# Patient Record
Sex: Male | Born: 1980 | Race: Black or African American | Hispanic: No | Marital: Single | Smoking: Never smoker
Health system: Southern US, Community
[De-identification: ages and names within clinical notes are randomized; demographics above are authoritative.]

## PROBLEM LIST (undated history)

## (undated) DIAGNOSIS — C801 Malignant (primary) neoplasm, unspecified: Secondary | ICD-10-CM

## (undated) HISTORY — DX: Malignant (primary) neoplasm, unspecified: C80.1

---

## 2004-12-01 ENCOUNTER — Ambulatory Visit (HOSPITAL_COMMUNITY): Admission: RE | Admit: 2004-12-01 | Discharge: 2004-12-01 | Payer: Self-pay | Admitting: Family Medicine

## 2009-02-08 ENCOUNTER — Encounter: Payer: Self-pay | Admitting: Pulmonary Disease

## 2009-03-03 DIAGNOSIS — C8190 Hodgkin lymphoma, unspecified, unspecified site: Secondary | ICD-10-CM | POA: Insufficient documentation

## 2009-03-04 ENCOUNTER — Ambulatory Visit: Payer: Self-pay | Admitting: Pulmonary Disease

## 2009-03-04 DIAGNOSIS — R0602 Shortness of breath: Secondary | ICD-10-CM

## 2009-03-14 LAB — CONVERTED CEMR LAB
ALT: 21 units/L (ref 0–53)
AST: 26 units/L (ref 0–37)
Albumin: 4.3 g/dL (ref 3.5–5.2)
Alkaline Phosphatase: 80 units/L (ref 39–117)
BUN: 13 mg/dL (ref 6–23)
Basophils Absolute: 0.4 10*3/uL — ABNORMAL HIGH (ref 0.0–0.1)
Basophils Relative: 6.1 % — ABNORMAL HIGH (ref 0.0–3.0)
Bilirubin, Direct: 0.1 mg/dL (ref 0.0–0.3)
CO2: 32 meq/L (ref 19–32)
Calcium: 9.3 mg/dL (ref 8.4–10.5)
Chloride: 100 meq/L (ref 96–112)
Creatinine, Ser: 0.9 mg/dL (ref 0.4–1.5)
Eosinophils Absolute: 0.1 10*3/uL (ref 0.0–0.7)
Eosinophils Relative: 1.2 % (ref 0.0–5.0)
GFR calc non Af Amer: 129.68 mL/min (ref >60)
Glucose, Bld: 86 mg/dL (ref 70–99)
HCT: 42.9 % (ref 39.0–52.0)
Hemoglobin: 14.3 g/dL (ref 13.0–17.0)
Lymphocytes Relative: 36.7 % (ref 12.0–46.0)
Lymphs Abs: 2.3 10*3/uL (ref 0.7–4.0)
MCHC: 33.3 g/dL (ref 30.0–36.0)
MCV: 91.8 fL (ref 78.0–100.0)
Monocytes Absolute: 0.7 10*3/uL (ref 0.1–1.0)
Monocytes Relative: 11.2 % (ref 3.0–12.0)
Neutro Abs: 2.8 10*3/uL (ref 1.4–7.7)
Neutrophils Relative %: 44.8 % (ref 43.0–77.0)
Platelets: 181 10*3/uL (ref 150.0–400.0)
Potassium: 4.1 meq/L (ref 3.5–5.1)
RBC: 4.68 M/uL (ref 4.22–5.81)
RDW: 12 % (ref 11.5–14.6)
Sodium: 137 meq/L (ref 135–145)
Total Bilirubin: 0.6 mg/dL (ref 0.3–1.2)
Total Protein: 7.1 g/dL (ref 6.0–8.3)
WBC: 6.3 10*3/uL (ref 4.5–10.5)

## 2009-04-15 ENCOUNTER — Ambulatory Visit: Payer: Self-pay | Admitting: Pulmonary Disease

## 2009-04-18 ENCOUNTER — Encounter: Payer: Self-pay | Admitting: Pulmonary Disease

## 2009-06-23 ENCOUNTER — Ambulatory Visit: Payer: Self-pay | Admitting: Pulmonary Disease

## 2009-06-23 DIAGNOSIS — J4489 Other specified chronic obstructive pulmonary disease: Secondary | ICD-10-CM | POA: Insufficient documentation

## 2009-06-23 DIAGNOSIS — J984 Other disorders of lung: Secondary | ICD-10-CM | POA: Insufficient documentation

## 2009-06-23 DIAGNOSIS — J449 Chronic obstructive pulmonary disease, unspecified: Secondary | ICD-10-CM

## 2009-07-20 ENCOUNTER — Telehealth (INDEPENDENT_AMBULATORY_CARE_PROVIDER_SITE_OTHER): Payer: Self-pay | Admitting: *Deleted

## 2009-07-22 ENCOUNTER — Telehealth (INDEPENDENT_AMBULATORY_CARE_PROVIDER_SITE_OTHER): Payer: Self-pay | Admitting: *Deleted

## 2009-10-03 ENCOUNTER — Telehealth (INDEPENDENT_AMBULATORY_CARE_PROVIDER_SITE_OTHER): Payer: Self-pay | Admitting: *Deleted

## 2009-10-14 ENCOUNTER — Telehealth (INDEPENDENT_AMBULATORY_CARE_PROVIDER_SITE_OTHER): Payer: Self-pay | Admitting: *Deleted

## 2009-11-03 ENCOUNTER — Telehealth: Payer: Self-pay | Admitting: Pulmonary Disease

## 2010-02-12 ENCOUNTER — Encounter: Payer: Self-pay | Admitting: Pulmonary Disease

## 2010-02-21 NOTE — Progress Notes (Signed)
Summary: nos appt  Phone Note Call from Patient   Caller: [email or web address]  Call For: Kelon Easom Summary of Call: ATC pt to rsc nos from 10/12, wrong number. Initial call taken by: Darletta Moll,  November 03, 2009 3:41 PM

## 2010-02-21 NOTE — Progress Notes (Signed)
Summary: letter re: breathalizer-ATCx2  Phone Note Call from Patient Call back at Home Phone [phone]   Caller: Patient Call For: SOOD Summary of Call: PT NEEDS LETTER IN DETAIL STATING "DIFFICULTY IN TAKING BREATHALIZER TEST DUE TO LUNG CONDITION". NEEDS DIAGNOSIS AS WELL. SAYS HE WENT DOWN TO MED RECS AND THE ONLY LETTER THEY HAD WAS ONE FROM JULY/ 2011. HE WANTS TO KNOW IF A CURRENT LETTER HAD ALREADY BEEN SENT TO HIS LAWYER. HE NEEDS THIS ASAP AS HIS COURT DATE IS OCT 10. [phone] (PT SAYS HE HAS VOICE MAIL AVAIL).  Initial call taken by: Tivis Ringer, CNA,  October 14, 2009 11:49 AM  Follow-up for Phone Call        called spoke with patient who states that he did receive the letter that was done by VS in june, but her his attorney it needs to specifically state that he has difficultly breathing in the breathalizer d/t his lung condition.  will forward to VS to see if this can be added to his letter.  pt aware will be later this afternoon before he receives a call back. Follow-up by: Boone Master CNA/MA,  October 14, 2009 12:13 PM  Additional Follow-up for Phone Call Additional follow up Details #1::        He was supposed to have CT chest and ROV with me after that.  This was scheduled in June 2011, but I am not sure he has gotten this done.  Advise him that he will need to have CT chest and ROV with me to further assess. Additional Follow-up by: Coralyn Helling MD,  October 14, 2009 1:48 PM    Additional Follow-up for Phone Call Additional follow up Details #2::    ATC pt x 2 attempts and no answer and message states voicemail not accepting messages. WCB Carron Curie CMA  October 14, 2009 2:53 PM  Spoke with pt and advised of recs per VS.  Pt verbalized understanding.  Appt sched with VS for 11/02/09 at 3:15 pm.  Pt advised to bring ct scan with him to appt.  Follow-up by: Vernie Murders,  October 14, 2009 5:01 PM

## 2010-02-21 NOTE — Miscellaneous (Signed)
Summary: Pulmonary function test   Pulmonary Function Test Date: 04/15/2009 Height (in.): 71 Gender: Male  Pre-Spirometry FVC    Value: 3.50 L/min   Pred: 5.59 L/min     % Pred: 63 % FEV1    Value: 1.63 L     Pred: 4.41 L     % Pred: 37 % FEV1/FVC  Value: 46 %     Pred: 78 %     % Pred: . % FEF 25-75  Value: 0.50 L/min   Pred: 4.63 L/min     % Pred: 11 %  Post-Spirometry FVC    Value: 3.64 L/min   Pred: 5.59 L/min     % Pred: 65 % FEV1    Value: 2.34 L     Pred: 4.41 L     % Pred: 53 % FEV1/FVC  Value: 64 %     Pred: 78 %     % Pred: . % FEF 25-75  Value: 1.21 L/min   Pred: 4.63 L/min     % Pred: 26 %  Lung Volumes TLC    Value: 4.38 L   % Pred: 59 % RV    Value: 0.88 L   % Pred: 47 % DLCO    Value: 19.4 %   % Pred: 58 % DLCO/VA  Value: 6.28 %   % Pred: 139 %  Comments: Severe obstruction.  Positive bronchodilator response.  Moderate restriction.  Moderate diffusion defect. Clinical Lists Changes  Observations: Added new observation of PFT COMMENTS: Severe obstruction.  Positive bronchodilator response.  Moderate restriction.  Moderate diffusion defect. (04/15/2009 13:16) Added new observation of DLCO/VA%EXP: 139 % (04/15/2009 13:16) Added new observation of DLCO/VA: 6.28 % (04/15/2009 13:16) Added new observation of DLCO % EXPEC: 58 % (04/15/2009 13:16) Added new observation of DLCO: 19.4 % (04/15/2009 13:16) Added new observation of RV % EXPECT: 47 % (04/15/2009 13:16) Added new observation of RV: 0.88 L (04/15/2009 13:16) Added new observation of TLC % EXPECT: 59 % (04/15/2009 13:16) Added new observation of TLC: 4.38 L (04/15/2009 13:16) Added new observation of FEF2575%EXPS: 26 % (04/15/2009 13:16) Added new observation of PSTFEF25/75P: 4.63  (04/15/2009 13:16) Added new observation of PSTFEF25/75%: 1.21 L/min (04/15/2009 13:16) Added new observation of PSTFEV1/FCV%: . % (04/15/2009 13:16) Added new observation of FEV1FVCPRDPS: 78 % (04/15/2009 13:16) Added new  observation of PSTFEV1/FVC: 64 % (04/15/2009 13:16) Added new observation of POSTFEV1%PRD: 53 % (04/15/2009 13:16) Added new observation of FEV1PRDPST: 4.41 L (04/15/2009 13:16) Added new observation of POST FEV1: 2.34 L/min (04/15/2009 13:16) Added new observation of POST FVC%EXP: 65 % (04/15/2009 13:16) Added new observation of FVCPRDPST: 5.59 L/min (04/15/2009 13:16) Added new observation of POST FVC: 3.64 L (04/15/2009 13:16) Added new observation of FEF % EXPEC: 11 % (04/15/2009 13:16) Added new observation of FEF25-75%PRE: 4.63 L/min (04/15/2009 13:16) Added new observation of FEF 25-75%: 0.50 L/min (04/15/2009 13:16) Added new observation of FEV1/FVC%EXP: . % (04/15/2009 13:16) Added new observation of FEV1/FVC PRE: 78 % (04/15/2009 13:16) Added new observation of FEV1/FVC: 46 % (04/15/2009 13:16) Added new observation of FEV1 % EXP: 37 % (04/15/2009 13:16) Added new observation of FEV1 PREDICT: 4.41 L (04/15/2009 13:16) Added new observation of FEV1: 1.63 L (04/15/2009 13:16) Added new observation of FVC % EXPECT: 63 % (04/15/2009 13:16) Added new observation of FVC PREDICT: 5.59 L (04/15/2009 13:16) Added new observation of FVC: 3.50 L (04/15/2009 13:16) Added new observation of PFT HEIGHT: 71  (04/15/2009 13:16) Added new observation of PFT DATE:  04/15/2009  (04/15/2009 13:16) 

## 2010-02-21 NOTE — Progress Notes (Signed)
Summary: letter by 09/14- ATC NA x6  Phone Note Call from Patient Call back at Home Phone [phone]   Caller: Patient Call For: sood Reason for Call: Talk to Nurse Summary of Call: pt needs another letter from VS stating that it would be extremely difficult for him to perform a breathalizer due to his lung condition.  He was stopped and they put on ticket he refused breathalizer because he couldn't get a reading from pt blowing.  Pt has a court date on Friday 09/16 so needs letter by Wednesday.  Have a copy of [employer] at [employer]  Print 2 copies so pt has copy also.  Initial call taken by: Eugene Gavia,  October 03, 2009 8:14 AM  Follow-up for Phone Call        ATC pt, voicemail full cannot leave message. WCB. Dr. Craige Cotta paged at 12pm. Carron Curie CMA  October 03, 2009 10:07 AM  ATC pt again, NA and mailbox is still full and could not leave msg Vernie Murders  October 03, 2009 1:40 PM  ATC x1. NA and mailbox is still full. Could not leave a msg. WCB later.Michel Bickers [facility]  October 04, 2009 8:52 AM  I have sent suponea down to [facility] in medical records, it is for all of pt records. I called med recs and spoke to dena and she said to send it to attention: Pam. I ATC pt again, NA and mailbox full. WCB.  Carron Curie CMA  October 05, 2009 9:23 AM  ATC pt at number in patient banner - was directed to a VM for pt's employer.  did not feel comfortable leaving message. Boone Master CNA/MA  October 05, 2009 5:30 PM    Additional Follow-up for Phone Call Additional follow up Details #1::        ATC pt again, mailbox is full.  This is the 6th attempt to contact the pt and so will sign off on msg and await for pt to call back. Additional Follow-up by: Vernie Murders,  October 06, 2009 2:12 PM

## 2010-02-21 NOTE — Assessment & Plan Note (Signed)
Summary: restrictive lung dz/apc   Copy to:  Elvina Sidle Primary Provider/Referring Provider:  Dr. Genia Hotter in [place], [place]  CC:  Pulmonary consult.  The patient c/o increased sob with exertion and a dry cough..  History of Present Illness: 30 yo male with dyspnea.  He has been feeling more short of breath over the past 4 months.  He was treated for Hodgkins lymphoma when he was 30 years old with chemotherapy and radiation therapy.    He used to exercise on a regular basis, but has not been able to keep up with this since he started working.  He does get a cough, but does not bring up sputum.  He denies wheeze, fever, chills, sweats, chest pain, weight loss, hemoptysis, gland swelling, leg swelling, rashes, or palpitations.  He remembers having a cold about 4 months ago when his symptoms started.  He has gained some weight since his exercise level has decreased.  He denies any prior history of asthma, pneumonia, or TB.  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  He denies animal exposure.  There is no recent sick exposures.  He last had a chest xray about 4 years ago.  He does not smoke cigarettes.  Spirometry from February 06, 2009 showed moderate restriction.  -  Date:  02/06/2009    FVC: 3.36    FVC % EXPECT: 64    FEV1: 3.11    FEV1 % EXP: 71    FEF 25-75% 4.53    FEF %Expect 97   Preventive Screening-Counseling & Management  Alcohol-Tobacco     Smoking Status: never      Drug Use:  yes.    Current Medications (verified): 1)  No Daily Medications  Allergies: 1)  ! Amoxicillin 2)  ! Sulfa  Past History:  Past Medical History: Last updated: 03/03/2009 Current Problems:  HODGKIN'S DISEASE (ICD-201.90)    Past Surgical History: None  Family History: Family History Breast Cancer---mother Family History Colon Cancer---father and PGF Family History Prostate Cancer---MGF  Social History: Patient admits to prior drug use.   Marijuana. Single [employer] weekly for his jobSmoking Status:  never Drug Use:  yes  Review of Systems       The patient complains of shortness of breath with activity, productive cough, non-productive cough, and nasal congestion/difficulty breathing through nose.  The patient denies shortness of breath at rest, coughing up blood, chest pain, irregular heartbeats, acid heartburn, indigestion, loss of appetite, weight change, abdominal pain, difficulty swallowing, sore throat, tooth/dental problems, headaches, sneezing, itching, ear ache, anxiety, depression, hand/feet swelling, joint stiffness or pain, rash, change in color of mucus, and fever.    Vital Signs:  Patient profile:   30 year old male Height:      70.5 inches (179.07 cm) Weight:      206 pounds (93.64 kg) BMI:     29.25 O2 Sat:      98 % on Room air Temp:     98.6 degrees F (37.00 degrees C) oral Pulse rate:   102 / minute BP sitting:   130 / 80  (left arm) Cuff size:   regular  Vitals Entered By: Michel Bickers CMA (March 04, 2009 4:46 PM)  O2 Sat at Rest %:  98 O2 Flow:  Room air CC: Pulmonary consult.  The patient c/o increased sob with exertion and a dry cough. Is Patient Diabetic? No   Physical Exam  General:  obese.   Eyes:  PERRLA  and EOMI.   Nose:  no deformity, discharge, inflammation, or lesions Mouth:  no deformity or lesions Neck:  no masses, thyromegaly, or abnormal cervical nodes Chest Wall:  no deformities noted Lungs:  clear bilaterally to auscultation and percussion Heart:  regular rate and rhythm, S1, S2 without murmurs, rubs, gallops, or clicks Abdomen:  bowel sounds positive; abdomen soft and non-tender without masses, or organomegaly Msk:  no deformity or scoliosis noted with normal posture Pulses:  pulses normal Extremities:  no clubbing, cyanosis, edema, or deformity noted Neurologic:  CN II-XII grossly intact with normal reflexes, coordination, muscle strength and  tone Cervical Nodes:  no significant adenopathy Psych:  alert and cooperative; normal mood and affect; normal attention span and concentration   Impression & Recommendations:  Problem # 1:  DYSPNEA (ICD-786.05) He has recent onset of dyspnea.  He has spirometry showing restrictive defect.  He has prior history of hodgkin's lymphoma and treatment with chemotherapy and radiation therapy to his chest.  He had a recent cold, and developed cough after this.  He could have developed reactive airways disease as a result of this.  He has also gained weight, and been less active since he had a change in his work schedule.  He could have a component of deconditioning.  To further assess this I will have him do lab work, chest xray, and full pulmonary function testing.  Depending on the results of this further interventions will be determined.  Medications Added to Medication List This Visit: 1)  No Daily Medications   Complete Medication List: 1)  No Daily Medications   Other Orders: Consultation Level IV (99244) TLB-BMP (Basic Metabolic Panel-BMET) (80048-METABOL) TLB-CBC Platelet - w/Differential (85025-CBCD) TLB-Hepatic/Liver Function Pnl (80076-HEPATIC) T-2 View CXR (71020TC) Full Pulmonary Function Test (PFT)  Patient Instructions: 1)  Lab work today 2)  Chest xray today 3)  Will schedule breathing test (PFT) 4)  Follow up in one to two weeks

## 2010-02-21 NOTE — Assessment & Plan Note (Signed)
Summary: rov ///kp   Copy to:  Elvina Sidle Primary Provider/Referring Provider:  Dr. Genia Hotter in [place], [place]  CC:  4 month follow up.  Pt would like to discuss results of PFT from 03/2008.   states breathing is the same-no better or worse.  occ chest tightness at night.  coughs at times-prod in am with yellow to brown mucus.  Denies wheezing. Marland Kitchen  History of Present Illness: 30 yo male with dyspnea.  He continues to have problems with his breathing.  He coughs on a regular basis, but does not bring up sputum.  If he does have sputum, then it is usually yellow in color.  He denies hemoptysis.  He will get occasional wheezing.  He feels cold at times, but denies fever.  He does get occasional sweats.  He denies chest pain, palpitations, weight loss, gland swelling, joint pain, or skin rashes.  Current Medications (verified): 1)  No Daily Medications  Allergies (verified): 1)  ! Amoxicillin 2)  ! Sulfa  Past History:  Past Medical History: Current Problems:  Hodgkins lymphoma dx 1999      - s/p chemo and radiation therapy Dyspnea      - PFT 04/15/09 FEV1 2.34 (53%), FEV1% 64, TLC 4.38(59%), DLCO 58%, +BD response  Past Surgical History: Reviewed history from 03/04/2009 and no changes required. None  Vital Signs:  Patient profile:   30 year old male Height:      71 inches Weight:      206 pounds BMI:     28.84 O2 Sat:      96 % on Room air Temp:     98.7 degrees F oral Pulse rate:   98 / minute BP sitting:   108 / 84  (right arm) Cuff size:   regular  Vitals Entered By: Gweneth Dimitri RN (June 23, 2009 12:05 PM)  O2 Flow:  Room air CC: 4 month follow up.  Pt would like to discuss results of PFT from 03/2008.   states breathing is the same-no better or worse.  occ chest tightness at night.  coughs at times-prod in am with yellow to brown mucus.  Denies wheezing.  Comments Medications reviewed with patient Daytime contact number verified with patient. Gweneth Dimitri RN   June 23, 2009 12:05 PM    Physical Exam  General:  obese.   Nose:  no deformity, discharge, inflammation, or lesions Mouth:  no deformity or lesions Neck:  no masses, thyromegaly, or abnormal cervical nodes Lungs:  clear bilaterally to auscultation and percussion Heart:  regular rate and rhythm, S1, S2 without murmurs, rubs, gallops, or clicks Abdomen:  bowel sounds positive; abdomen soft and non-tender without masses, or organomegaly Extremities:  no clubbing, cyanosis, edema, or deformity noted Cervical Nodes:  no significant adenopathy   CXR  Procedure date:  03/04/2009  Findings:      CHEST - 2 VIEW   Comparison: None.   Findings:  The heart size and mediastinal contours are within normal limits.  Both lungs are clear.  The visualized skeletal structures are unremarkable.   IMPRESSION: No active cardiopulmonary disease.   Impression & Recommendations:  Problem # 1:  DYSPNEA (ICD-786.05) He has prior history of Hodgkin's lymphoma.  He has both obstructive and restritive defect on pulmonary function testing.  Problem # 2:  CHRONIC OBSTRUCTIVE PULMONARY DISEASE (ICD-496) He has severe airflow obstruction on pulmonary function testing with response to bronchodilator challenge.  I will start him on symbicort and as  needed proair.  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  I will arrange for a CT chest with IV contrast to further evaluate.  Medications Added to Medication List This Visit: 1)  Symbicort 160-4.5 Mcg/act Aero (Budesonide-formoterol fumarate) .... Two puffs two times a day 2)  Proair Hfa 108 (90 Base) Mcg/act Aers (Albuterol sulfate) .... Two puffs up to four times per day as needed  Complete Medication List: 1)  Symbicort 160-4.5 Mcg/act Aero (Budesonide-formoterol fumarate) .... Two puffs two times a day 2)  Proair Hfa 108 (90 Base) Mcg/act Aers (Albuterol sulfate)  .... Two puffs up to four times per day as needed  Other Orders: Est. Patient Level III (28413) Radiology Referral (Radiology)  Patient Instructions: 1)  You have obstructive and restrictive lung disease 2)  Will schedule CT chest 3)  Symbicort two puffs two times a day, and rinse mouth after using 4)  Proair two puffs up to four times per day as needed for cough, wheeze, chest congestion, or shortness of breath 5)  Follow up in 3 to 4 weeks Prescriptions: PROAIR HFA 108 (90 BASE) MCG/ACT AERS (ALBUTEROL SULFATE) two puffs up to four times per day as needed  #1 x 3   Entered and Authorized by:   Coralyn Helling MD   Signed by:   Coralyn Helling MD on 06/23/2009   Method used:   Electronically to        [facility] [address] (retail)       [address]       [place], [place]  [id number]       Ph: [phone]       Fax: [phone]   RxID:   [id number] SYMBICORT 160-4.5 MCG/ACT AERO (BUDESONIDE-FORMOTEROL FUMARATE) two puffs two times a day  #1 x 3   Entered and Authorized by:   Coralyn Helling MD   Signed by:   Coralyn Helling MD on 06/23/2009   Method used:   Electronically to        [facility] [address] (retail)       [address]       [place], [place]  [id number]       Ph: [phone]       Fax: [phone]   RxID:   [id number]

## 2010-02-21 NOTE — Miscellaneous (Signed)
Summary: Orders Update pft charges  Clinical Lists Changes  Orders: Added new Service order of Carbon Monoxide diffusing w/capacity (94720) - Signed Added new Service order of Lung Volumes (94240) - Signed Added new Service order of Spirometry (Pre & Post) (94060) - Signed 

## 2010-02-21 NOTE — Progress Notes (Signed)
Summary: letter ready?  Phone Note Call from Patient Call back at Home Phone [phone]   Caller: Patient Call For: sood Summary of Call: pt wants to know if his letter is ready to be picked up (see 6/29 msg).  Initial call taken by: Tivis Ringer, CNA,  July 22, 2009 12:35 PM  Follow-up for Phone Call        Letter is ready and up front for pick up.  Spoke with pt and made aware this was done. Follow-up by: Vernie Murders,  July 22, 2009 12:43 PM

## 2010-02-21 NOTE — Progress Notes (Signed)
Summary: letter  Phone Note Call from Patient Call back at Home Phone [phone]   Caller: Patient Call For: sood Reason for Call: Talk to Nurse Summary of Call: pt would like narrative explaing his condition.  pt was stopped and given a breathalizer test.  Due to his lung condition, he was unable to blow enough for it to register.  Officer put it down as a refusal and took pt's license.  Attorney told him to get letter from you and has a hearing next week(07/06, would need by 07/05), he could get his license back.  He has been diagnosed w/restrictive lung ds, COPD  and he has chemo and radiation.  All this needs to go into letter to explain his health condition. Initial call taken by: Travis Garrett,  July 20, 2009 9:30 AM  Follow-up for Phone Call        Please advise thanks Travis Garrett  July 20, 2009 9:47 AM   Additional Follow-up for Phone Call Additional follow up Details #1::        Please inform that pt that I have dictated a letter stating his respiratory condition.  He can arrange to have this picked up or mailed to him after it is transcribed. Additional Follow-up by: Travis Helling MD,  July 20, 2009 12:13 PM     Appended Document: letter Spoke with pt and advised that VS dictated a letter for him and we will call him once transciption is done so he can pick up.  Will hold in triage until this is done.  Appended Document: letter The patient states he picked this letter up on Friday, 07/22/2009.

## 2010-06-09 NOTE — Letter (Signed)
July 20, 2009     RE:  [name], [name]  MRN:  [id number]  /  DOB:  [birth year]   To Whom It May Concern:   Travis Garrett is under my care at [facility] in [place],  [place].  He has severe obstructive lung disease as well as  restrictive lung disease.  In addition, he has a history of Hodgkin's  lymphoma and has previous treatment with chemotherapy and radiation  therapy.   If you have any questions regarding this, please feel free to contact me  at [phone].    Sincerely,      Coralyn Helling, MD  Electronically Signed    VS/MedQ  DD: 07/20/2009  DT: 07/21/2009  Job #: [id number]

## 2011-04-22 ENCOUNTER — Ambulatory Visit (INDEPENDENT_AMBULATORY_CARE_PROVIDER_SITE_OTHER): Payer: BC Managed Care – PPO | Admitting: Internal Medicine

## 2011-04-22 VITALS — BP 114/76 | HR 85 | Temp 98.5°F | Resp 16 | Ht 69.0 in | Wt 211.0 lb

## 2011-04-22 DIAGNOSIS — L03319 Cellulitis of trunk, unspecified: Secondary | ICD-10-CM

## 2011-04-22 DIAGNOSIS — L02214 Cutaneous abscess of groin: Secondary | ICD-10-CM

## 2011-04-22 MED ORDER — DOXYCYCLINE HYCLATE 100 MG PO TABS: 100.0000 mg | ORAL_TABLET | Freq: Two times a day (BID) | ORAL | Status: AC

## 2011-04-22 NOTE — Progress Notes (Signed)
  Subjective:    Patient ID: Travis Garrett, male    DOB: July 24, 1980, 31 y.o.   MRN: [id number]  HPIThree-day history of pain with swelling in the left groin/the pain is mild He has a history of an abscess in the groin on the other side one year ago    Review of Systems     Objective:   Physical ExamThere is a 1 cm x 3 cm indurated area that is not read but is tender in the left groin adjacent to the testicle/scrotum        Assessment & Plan:  Problem #1 early abscess left groin  He would like to avoid I&D if possible Doxycycline 100 twice a day #20 Hot compresses or soaks for 20 minutes twice a day Followup for surgery if needed

## 2011-04-24 ENCOUNTER — Telehealth: Payer: Self-pay

## 2011-04-24 NOTE — Telephone Encounter (Signed)
If it has fully resolved then yes. Just keep it covered.  Travis Garrett

## 2011-04-24 NOTE — Telephone Encounter (Signed)
LMOM TO CB 

## 2011-04-24 NOTE — Telephone Encounter (Signed)
PT RECENTLY TREATED FOR ABSCESS ON LEG,MUCH BETTER,WANTS TO KNOW IF IT IS OK FOR HIM TO GO GO GYM  TO WORK OUT.   BEST PHONE [phone]

## 2011-04-25 NOTE — Telephone Encounter (Signed)
Gave pt instr's from [place]. Pt agreed

## 2011-05-24 ENCOUNTER — Ambulatory Visit (INDEPENDENT_AMBULATORY_CARE_PROVIDER_SITE_OTHER): Payer: BC Managed Care – PPO | Admitting: Family Medicine

## 2011-05-24 VITALS — BP 115/81 | HR 85 | Temp 98.5°F | Resp 18 | Ht 69.0 in | Wt 205.2 lb

## 2011-05-24 DIAGNOSIS — K130 Diseases of lips: Secondary | ICD-10-CM

## 2011-05-24 DIAGNOSIS — R05 Cough: Secondary | ICD-10-CM

## 2011-05-24 LAB — POCT CBC
Lymph, poc: 2.2 (ref 0.6–3.4)
MCH, POC: 29.9 pg (ref 27–31.2)
MCHC: 32.7 g/dL (ref 31.8–35.4)
MCV: 91.5 fL (ref 80–97)
MID (cbc): 0.6 (ref 0–0.9)
MPV: 10.1 fL (ref 0–99.8)
POC LYMPH PERCENT: 31.9 %L (ref 10–50)
POC MID %: 8.2 %M (ref 0–12)
Platelet Count, POC: 254 10*3/uL (ref 142–424)
RBC: 5.39 M/uL (ref 4.69–6.13)
RDW, POC: 13.8 %
WBC: 7 10*3/uL (ref 4.6–10.2)

## 2011-05-24 MED ORDER — AZITHROMYCIN 250 MG PO TABS: ORAL_TABLET | ORAL | Status: AC

## 2011-05-24 MED ORDER — VALACYCLOVIR HCL 1 G PO TABS: ORAL_TABLET | ORAL | Status: DC

## 2011-05-24 MED ORDER — HYDROCODONE-HOMATROPINE 5-1.5 MG/5ML PO SYRP: 5.0000 mL | ORAL_SOLUTION | Freq: Three times a day (TID) | ORAL | Status: AC | PRN

## 2011-05-24 NOTE — Progress Notes (Signed)
Patient Name: Travis Garrett Date of Birth: 05-07-1980 Medical Record Number: [id number] Gender: male Date of Encounter: 05/24/2011  History of Present Illness:  Travis Garrett is a 31 y.o. very pleasant male patient who presents with the following:  Here with cough for about a week- had been productive but is now dry.  Also runny nose, no eye symptoms, no sneezing.  Also noted some irritation inside his lip over the last cople of days.  No history of HSV but he is very concerned that this might represent a cold sore.  No GI symptoms, no fever.  He did note some chills and aches but these are now better.   Does have a ST, but no earache.   These sympotms started when he was in [place] . Marland Kitchen    History of Hodgkin's disease at 31 years old but is in remission.  Otherwise healthy  Patient Active Problem List  Diagnoses  . HODGKIN'S DISEASE  . CHRONIC OBSTRUCTIVE PULMONARY DISEASE  . RESTRICTIVE LUNG DISEASE  . DYSPNEA   No past medical history on file. No past surgical history on file. History  Substance Use Topics  . Smoking status: Never Smoker   . Smokeless tobacco: Not on file  . Alcohol Use: Not on file   No family history on file. Allergies  Allergen Reactions  . Amoxicillin   . Sulfonamide Derivatives     Medication list has been reviewed and updated.  Review of Systems: As per HPI- otherwise negative.   Physical Examination: Filed Vitals:   05/24/11 1444  BP: 115/81  Pulse: 85  Temp: 98.5 F (36.9 C)  TempSrc: Oral  Resp: 18  Height: 5\' 9"  (1.753 m)  Weight: 205 lb 3.2 oz (93.078 kg)    Body mass index is 30.30 kg/(m^2).  GEN: WDWN, NAD, Non-toxic, A & O x 3 HEENT: Atraumatic, Normocephalic. Neck supple. No masses, No LAD.  Tm, oropharynx wnl.  Nasal cavity with some congestion, negative sinuses.  Unable to appreciate any abnormality of his lip Ears and Nose: No external deformity. CV: RRR, No M/G/R. No JVD. No thrill. No extra heart  sounds. PULM: CTA B, no wheezes, crackles, rhonchi. No retractions. No resp. distress. No accessory muscle use. EXTR: No c/c/e NEURO Normal gait.  PSYCH: Normally interactive. Conversant. Not depressed or anxious appearing.  Calm demeanor.   Results for orders placed in visit on 05/24/11  POCT CBC      Component Value Range   WBC 7.0  4.6 - 10.2 (K/uL)   Lymph, poc 2.2  0.6 - 3.4    POC LYMPH PERCENT 31.9  10 - 50 (%L)   MID (cbc) 0.6  0 - 0.9    POC MID % 8.2  0 - 12 (%M)   POC Granulocyte 4.2  2 - 6.9    Granulocyte percent 59.9  37 - 80 (%G)   RBC 5.39  4.69 - 6.13 (M/uL)   Hemoglobin 16.1  14.1 - 18.1 (g/dL)   HCT, POC 43.3  29.5 - 53.7 (%)   MCV 91.5  80 - 97 (fL)   MCH, POC 29.9  27 - 31.2 (pg)   MCHC 32.7  31.8 - 35.4 (g/dL)   RDW, POC 18.8     Platelet Count, POC 254  142 - 424 (K/uL)   MPV 10.1  0 - 99.8 (fL)    Assessment and Plan: 1. Cough  POCT CBC, HYDROcodone-homatropine (HYCODAN) 5-1.5 MG/5ML syrup, azithromycin (ZITHROMAX) 250 MG tablet  2. Lip lesion  valACYclovir (VALTREX) 1000 MG tablet   Likely viral URI vs AR.  Gave Rx for hycodan to use as needed, also recommend an OTC allergy medication.  However, if he is not better in a few days may fill and take zpack- in this case please call me with an update.  He is very concerned that the sensation he feels on his upper lip may herald a cold sore, and would like to take valtrex.  This is unlikely to be harmful so I gave him a prescription to use as needed.  Patient (or parent if minor) instructed to return to clinic or call if not better in 3-4 day(s).

## 2011-08-12 ENCOUNTER — Ambulatory Visit (INDEPENDENT_AMBULATORY_CARE_PROVIDER_SITE_OTHER): Payer: BC Managed Care – PPO | Admitting: Emergency Medicine

## 2011-08-12 VITALS — BP 132/70 | HR 85 | Temp 97.7°F | Resp 17 | Ht 68.5 in | Wt 205.0 lb

## 2011-08-12 DIAGNOSIS — H1030 Unspecified acute conjunctivitis, unspecified eye: Secondary | ICD-10-CM

## 2011-08-12 MED ORDER — TOBRAMYCIN 0.3 % OP SOLN: 2.0000 [drp] | OPHTHALMIC | Status: DC

## 2011-08-12 NOTE — Patient Instructions (Signed)
Conjunctivitis Conjunctivitis is commonly called "pink eye." Conjunctivitis can be caused by bacterial or viral infection, allergies, or injuries. There is usually redness of the lining of the eye, itching, discomfort, and sometimes discharge. There may be deposits of matter along the eyelids. A viral infection usually causes a watery discharge, while a bacterial infection causes a yellowish, thick discharge. Pink eye is very contagious and spreads by direct contact. You may be given antibiotic eyedrops as part of your treatment. Before using your eye medicine, remove all drainage from the eye by washing gently with warm water and cotton balls. Continue to use the medication until you have awakened 2 mornings in a row without discharge from the eye. Do not rub your eye. This increases the irritation and helps spread infection. Use separate towels from other household members. Wash your hands with soap and water before and after touching your eyes. Use cold compresses to reduce pain and sunglasses to relieve irritation from light. Do not wear contact lenses or wear eye makeup until the infection is gone. SEEK MEDICAL CARE IF:   Your symptoms are not better after 3 days of treatment.   You have increased pain or trouble seeing.   The outer eyelids become very red or swollen.  Document Released: 02/16/2004 Document Revised: 12/28/2010 Document Reviewed: 01/08/2005 [employer] Patient Information 2012 [employer]. 

## 2011-08-12 NOTE — Progress Notes (Signed)
  Subjective:    Patient ID: Travis Garrett, male    DOB: [birth year], 31 y.o.   MRN: [id number]  Eye Pain  There is pain in the right eye. This is a new problem. The current episode started yesterday. The problem occurs constantly. The problem has been unchanged. There was no injury mechanism. The pain is mild. There is no known exposure to pink eye. He does not wear contacts. Associated symptoms include an eye discharge and eye redness. Pertinent negatives include no blurred vision, double vision, fever, foreign body sensation, itching, nausea, photophobia, recent URI or vomiting. He has tried eye drops for the symptoms. The treatment provided no relief.      Review of Systems  Constitutional: Negative.  Negative for fever.  HENT: Positive for congestion and rhinorrhea.   Eyes: Positive for pain, discharge and redness. Negative for blurred vision, double vision and photophobia.  Respiratory: Positive for cough.   Cardiovascular: Negative.   Gastrointestinal: Negative.  Negative for nausea and vomiting.  Genitourinary: Negative.   Skin: Negative for itching.       Objective:   Physical Exam  Constitutional: He appears well-developed.  HENT:  Head: Normocephalic and atraumatic.  Eyes: Pupils are equal, round, and reactive to light. Right eye exhibits discharge. Right conjunctiva is injected. Right pupil is round and reactive. Left pupil is round and reactive. Pupils are equal.          Assessment & Plan:  Conjunctivitis  Sulamyd

## 2011-08-18 ENCOUNTER — Telehealth: Payer: Self-pay

## 2011-08-18 NOTE — Telephone Encounter (Signed)
Pt was recently given drops for his right eye and he has lost them and would like to have them called back into [facility]   Best number [phone]

## 2011-08-19 ENCOUNTER — Ambulatory Visit (INDEPENDENT_AMBULATORY_CARE_PROVIDER_SITE_OTHER): Payer: BC Managed Care – PPO | Admitting: Family Medicine

## 2011-08-19 VITALS — BP 98/72 | HR 87 | Temp 98.3°F | Resp 16

## 2011-08-19 DIAGNOSIS — H109 Unspecified conjunctivitis: Secondary | ICD-10-CM

## 2011-08-19 DIAGNOSIS — J069 Acute upper respiratory infection, unspecified: Secondary | ICD-10-CM

## 2011-08-19 MED ORDER — CIPROFLOXACIN HCL 0.3 % OP SOLN: 1.0000 [drp] | OPHTHALMIC | Status: AC

## 2011-08-19 NOTE — Progress Notes (Signed)
Subjective:    Patient ID: Travis Garrett, male    DOB: 04/11/1980, 31 y.o.   MRN: [id number]  HPI Travis Garrett is a 31 y.o. male Treated for R eye conjunctivitis 08/12/11 with tobramycin gtts - 2 drops used every 2 hours - ran out 2 days ago, had been using them in R eye only, was feeling better.  L eye now affected - red past 2 days.  Watery discharge from R eye only during day with yellow crust in am. , but less redness and swelling.   Took leftover antibiotic yesterday - doxycycline x 2. Seemed better.  Has had some cold symptoms - past week.  Same as last week.  No fever.  No allergies.    Review of Systems  Constitutional: Negative for fever and chills.  HENT: Positive for congestion and rhinorrhea.   Eyes: Positive for pain and discharge. Negative for photophobia.       Objective:   Physical Exam  Constitutional: He is oriented to person, place, and time. He appears well-developed and well-nourished.  HENT:  Head: Normocephalic and atraumatic.  Right Ear: Tympanic membrane, external ear and ear canal normal.  Left Ear: Tympanic membrane, external ear and ear canal normal.  Nose: No rhinorrhea.  Mouth/Throat: Oropharynx is clear and moist and mucous membranes are normal. No oropharyngeal exudate or posterior oropharyngeal erythema.  Eyes: EOM and lids are normal. Pupils are equal, round, and reactive to light. Right eye exhibits no discharge. No foreign body present in the right eye. Left eye exhibits no discharge. No foreign body present in the left eye. Right conjunctiva is injected. Left conjunctiva is injected.       No exudate in canthi. No lid edema.   Neck: Neck supple.  Cardiovascular: Normal rate, regular rhythm, normal heart sounds and intact distal pulses.   No murmur heard. Pulmonary/Chest: Effort normal and breath sounds normal. He has no wheezes. He has no rhonchi. He has no rales.  Abdominal: Soft. There is no tenderness.  Lymphadenopathy:    He has no  cervical adenopathy.  Neurological: He is alert and oriented to person, place, and time.  Skin: Skin is warm and dry. No rash noted.  Psychiatric: He has a normal mood and affect. His behavior is normal.       Assessment & Plan:  Travis Garrett is a 31 y.o. male 1. Conjunctivitis of both eyes  ciprofloxacin (CILOXAN) 0.3 % ophthalmic solution  2. URI (upper respiratory infection)     Viral cause with URi, vs secondary bact conjunctivitis - now on L side as well (early). Trial of ciloxan gtts, and recheck in 3-4 days if not improving.  Patient Instructions  Conjunctivitis Conjunctivitis is commonly called "pink eye." Conjunctivitis can be caused by bacterial or viral infection, allergies, or injuries. There is usually redness of the lining of the eye, itching, discomfort, and sometimes discharge. There may be deposits of matter along the eyelids. A viral infection usually causes a watery discharge, while a bacterial infection causes a yellowish, thick discharge. Pink eye is very contagious and spreads by direct contact. You may be given antibiotic eyedrops as part of your treatment. Before using your eye medicine, remove all drainage from the eye by washing gently with warm water and cotton balls. Continue to use the medication until you have awakened 2 mornings in a row without discharge from the eye. Do not rub your eye. This increases the irritation and helps spread infection. Use separate towels  from other household members. Wash your hands with soap and water before and after touching your eyes. Use cold compresses to reduce pain and sunglasses to relieve irritation from light. Do not wear contact lenses or wear eye makeup until the infection is gone. SEEK MEDICAL CARE IF:   Your symptoms are not better after 3 days of treatment.   You have increased pain or trouble seeing.   The outer eyelids become very red or swollen.  Document Released: 02/16/2004 Document Revised: 12/28/2010  Document Reviewed: 01/08/2005 [facility] Patient Information 2012 [place], [place].   Start new antibiotic drops.  Of to use refresh eye drops as needed for dry eyes.  If not improving in next 3-4 days, return for recheck. Return to the clinic or go to the nearest emergency room if any of your symptoms worsen or new symptoms occur.    '

## 2011-08-19 NOTE — Patient Instructions (Signed)
Conjunctivitis Conjunctivitis is commonly called "pink eye." Conjunctivitis can be caused by bacterial or viral infection, allergies, or injuries. There is usually redness of the lining of the eye, itching, discomfort, and sometimes discharge. There may be deposits of matter along the eyelids. A viral infection usually causes a watery discharge, while a bacterial infection causes a yellowish, thick discharge. Pink eye is very contagious and spreads by direct contact. You may be given antibiotic eyedrops as part of your treatment. Before using your eye medicine, remove all drainage from the eye by washing gently with warm water and cotton balls. Continue to use the medication until you have awakened 2 mornings in a row without discharge from the eye. Do not rub your eye. This increases the irritation and helps spread infection. Use separate towels from other household members. Wash your hands with soap and water before and after touching your eyes. Use cold compresses to reduce pain and sunglasses to relieve irritation from light. Do not wear contact lenses or wear eye makeup until the infection is gone. SEEK MEDICAL CARE IF:   Your symptoms are not better after 3 days of treatment.   You have increased pain or trouble seeing.   The outer eyelids become very red or swollen.  Document Released: 02/16/2004 Document Revised: 12/28/2010 Document Reviewed: 01/08/2005 [facility] Patient Information 2012 [place], [place].   Start new antibiotic drops.  Of to use refresh eye drops as needed for dry eyes.  If not improving in next 3-4 days, return for recheck. Return to the clinic or go to the nearest emergency room if any of your symptoms worsen or new symptoms occur.

## 2012-01-03 ENCOUNTER — Ambulatory Visit (INDEPENDENT_AMBULATORY_CARE_PROVIDER_SITE_OTHER): Payer: BC Managed Care – PPO | Admitting: Family Medicine

## 2012-01-03 ENCOUNTER — Ambulatory Visit: Payer: BC Managed Care – PPO

## 2012-01-03 VITALS — BP 129/85 | HR 68 | Temp 98.3°F | Resp 16 | Ht 68.0 in | Wt 197.0 lb

## 2012-01-03 DIAGNOSIS — S6990XA Unspecified injury of unspecified wrist, hand and finger(s), initial encounter: Secondary | ICD-10-CM

## 2012-01-03 DIAGNOSIS — M79609 Pain in unspecified limb: Secondary | ICD-10-CM

## 2012-01-03 DIAGNOSIS — S6010XA Contusion of unspecified finger with damage to nail, initial encounter: Secondary | ICD-10-CM

## 2012-01-03 DIAGNOSIS — S6000XA Contusion of unspecified finger without damage to nail, initial encounter: Secondary | ICD-10-CM

## 2012-01-03 DIAGNOSIS — M79646 Pain in unspecified finger(s): Secondary | ICD-10-CM

## 2012-01-03 MED ORDER — TRAMADOL HCL 50 MG PO TABS: 50.0000 mg | ORAL_TABLET | Freq: Three times a day (TID) | ORAL | Status: DC | PRN

## 2012-01-03 NOTE — Progress Notes (Signed)
Patient ID: BERTIN INABINET MRN: [id number], DOB: 1980/04/09, 31 y.o. Date of Encounter: 01/03/2012, 4:41 PM    PROCEDURE NOTE: Verbal consent obtained. Alcohol prep. Digital block with 2% lidocaine plain. 0.5 cm incision made with 11 blade along lesion.  Moderate amount of bloody drainage expressed. Dressed. Wound care instructions including precautions with patient. Patient tolerated the procedure well.  Loop cautery used to puncture #2 holes in nail releasing moderate amount of bloody drainage Cleaned and bandaged. Patient tolerated well. Finger placed in metal fold over splint for comfort.  Follow up in 1 week for recheck.   Grier Mitts, PA-C 01/03/2012 4:41 PM

## 2012-01-03 NOTE — Progress Notes (Signed)
[facility]:  Office Visit  Chief Complaint:  Chief Complaint  Patient presents with  . Hand Pain    right index finger smashed x 2 days    HPI: Travis Garrett is a 31 y.o. male who complains of smashed right index finger 2 days ago, constant throbbing pain 8/10 pain at night, bt today it is a 4 . Can't bend it. Took Tylenol with minimal relief.   Past Medical History  Diagnosis Date  . Cancer    History reviewed. No pertinent past surgical history. History   Social History  . Marital Status: Single    Spouse Name: N/A    Number of Children: N/A  . Years of Education: N/A   Social History Main Topics  . Smoking status: Never Smoker   . Smokeless tobacco: None  . Alcohol Use: None  . Drug Use: None  . Sexually Active: None   Other Topics Concern  . None   Social History Narrative  . None   No family history on file. Allergies  Allergen Reactions  . Amoxicillin   . Sulfonamide Derivatives    Prior to Admission medications   Medication Sig Start Date End Date Taking? Authorizing Provider  valACYclovir (VALTREX) 1000 MG tablet Two pills by mouth every 12 hours for 2 doses per episode of cold sore 05/24/11 05/23/12  Gwenlyn Found Copland, MD     ROS: The patient denies fevers, chills, night sweats, unintentional weight loss, chest pain, palpitations, wheezing, dyspnea on exertion, nausea, vomiting, abdominal pain, dysuria, hematuria, melena, numbness, weakness, or tingling.  All other systems have been reviewed and were otherwise negative with the exception of those mentioned in the HPI and as above.    PHYSICAL EXAM: Filed Vitals:   01/03/12 1448  BP: 129/85  Pulse: 68  Temp: 98.3 F (36.8 C)  Resp: 16   Filed Vitals:   01/03/12 1448  Height: 5\' 8"  (1.727 m)  Weight: 197 lb (89.359 kg)   Body mass index is 29.95 kg/(m^2).  General: Alert, no acute distress HEENT:  Normocephalic, atraumatic, oropharynx patent.  Cardiovascular:  Regular  rate and rhythm, no rubs murmurs or gallops.  No Carotid bruits, radial pulse intact. No pedal edema.  Respiratory: Clear to auscultation bilaterally.  No wheezes, rales, or rhonchi.  No cyanosis, no use of accessory musculature GI: No organomegaly, abdomen is soft and non-tender, positive bowel sounds.  No masses. Skin: No rashes. Neurologic: Facial musculature symmetric. Psychiatric: Patient is appropriate throughout our interaction. Lymphatic: No cervical lymphadenopathy Musculoskeletal: Gait intact. Right index finger-+ large nail hematoma.  +subungal hematoma ROM intact. + swelling, tendon at DIP and PIP intact   LABS: Results for orders placed in visit on 05/24/11  POCT CBC      Component Value Range   WBC 7.0  4.6 - 10.2 K/uL   Lymph, poc 2.2  0.6 - 3.4   POC LYMPH PERCENT 31.9  10 - 50 %L   MID (cbc) 0.6  0 - 0.9   POC MID % 8.2  0 - 12 %M   POC Granulocyte 4.2  2 - 6.9   Granulocyte percent 59.9  37 - 80 %G   RBC 5.39  4.69 - 6.13 M/uL   Hemoglobin 16.1  14.1 - 18.1 g/dL   HCT, POC 16.1  09.6 - 53.7 %   MCV 91.5  80 - 97 fL   MCH, POC 29.9  27 - 31.2 pg   MCHC 32.7  31.8 - 35.4 g/dL   RDW, POC 54.0     Platelet Count, POC 254  142 - 424 K/uL   MPV 10.1  0 - 99.8 fL     EKG/XRAY:   Primary read interpreted by Dr. Conley Rolls at [facility]. No obvious fractures, dislocation   ASSESSMENT/PLAN: Encounter Diagnoses  Name Primary?  . Injury of index finger Yes  . Hematoma, subungual, finger    Wound care as directed Tramadol prn pain Finger splint F/u prn otherwise will see him in 1 week     Yunis Voorheis PHUONG, DO 01/03/2012 3:48 PM

## 2013-02-06 ENCOUNTER — Ambulatory Visit (INDEPENDENT_AMBULATORY_CARE_PROVIDER_SITE_OTHER): Payer: BC Managed Care – PPO | Admitting: Family Medicine

## 2013-02-06 ENCOUNTER — Ambulatory Visit: Payer: BC Managed Care – PPO

## 2013-02-06 VITALS — BP 120/84 | HR 82 | Temp 99.1°F | Resp 16 | Ht 69.0 in | Wt 201.0 lb

## 2013-02-06 DIAGNOSIS — J189 Pneumonia, unspecified organism: Secondary | ICD-10-CM

## 2013-02-06 DIAGNOSIS — R059 Cough, unspecified: Secondary | ICD-10-CM

## 2013-02-06 DIAGNOSIS — R509 Fever, unspecified: Secondary | ICD-10-CM

## 2013-02-06 DIAGNOSIS — R05 Cough: Secondary | ICD-10-CM

## 2013-02-06 LAB — POCT CBC
Granulocyte percent: 57 %G (ref 37–80)
HCT, POC: 46.2 % (ref 43.5–53.7)
Hemoglobin: 14.7 g/dL (ref 14.1–18.1)
LYMPH, POC: 2.1 (ref 0.6–3.4)
MCH: 30 pg (ref 27–31.2)
MCHC: 31.8 g/dL (ref 31.8–35.4)
MCV: 94.3 fL (ref 80–97)
MID (cbc): 0.6 (ref 0–0.9)
MPV: 9.7 fL (ref 0–99.8)
PLATELET COUNT, POC: 209 10*3/uL (ref 142–424)
POC Granulocyte: 3.7 (ref 2–6.9)
POC LYMPH PERCENT: 33 %L (ref 10–50)
POC MID %: 10 %M (ref 0–12)
RBC: 4.9 M/uL (ref 4.69–6.13)
RDW, POC: 13.7 %
WBC: 6.5 10*3/uL (ref 4.6–10.2)

## 2013-02-06 MED ORDER — HYDROCODONE-HOMATROPINE 5-1.5 MG/5ML PO SYRP: 5.0000 mL | ORAL_SOLUTION | Freq: Three times a day (TID) | ORAL | Status: AC | PRN

## 2013-02-06 MED ORDER — AZITHROMYCIN 250 MG PO TABS: ORAL_TABLET | ORAL | Status: AC

## 2013-02-06 NOTE — Patient Instructions (Signed)
Use the antibiotic as directed and the cough syrup as needed.  Remember the cough syrup can make you drowsy so do not use it when you need to drive.   Let us know if you are not better in the next few days- Sooner if worse.

## 2013-02-06 NOTE — Progress Notes (Signed)
[facility] [address] [place] [address] [phone]  Date:  02/06/2013   Name:  Travis Garrett   DOB:  1980/07/20   MRN:  [id number]  PCP:  No primary provider on file.    Chief Complaint: chest congestion, Nasal Congestion and cbc   History of Present Illness:  Travis Garrett is a 33 y.o. very pleasant male patient who presents with the following:  He has noted almost a week of cough, congestion, and nasal congestion/ sneezing.  He has felt feverish at times at home but has not checked his temp.   The cough is productive in the am.  However it is becoming more dry.   He has not noted a ST or earache.   He has not had any GI symptoms.   He has used some OTC medication as needed.  No meds yet today.    He has a history of Hodgkin's disease.  He was treated in [place] in 1997 and has been in remission since 1998. He was treated with radiation for 3 months and 6 months of chemo.   He does not have a PCP here in [place]  He had some pulmonary testing in the past that might have showed COPD? However he states he is not sure if this was an accurate dx.  When he is not sick he does not have any breathing issues. He does exercise usually; he has not exercised for the last few months but prior to this he was running a mile each day and lifting weights without difficulty.   Does smoke MJ daily- has done so for around 8 years.  Does not smoke tobacco   Patient Active Problem List   Diagnosis Date Noted  . CHRONIC OBSTRUCTIVE PULMONARY DISEASE 06/23/2009  . RESTRICTIVE LUNG DISEASE 06/23/2009  . DYSPNEA 03/04/2009  . [facility] DISEASE 03/03/2009    Past Medical History  Diagnosis Date  . Cancer     History reviewed. No pertinent past surgical history.  History  Substance Use Topics  . Smoking status: Never Smoker   . Smokeless tobacco: Not on file  . Alcohol Use: No    History reviewed. No pertinent family history.  Allergies  Allergen  Reactions  . Amoxicillin   . Sulfonamide Derivatives     Medication list has been reviewed and updated.  Current Outpatient Prescriptions on File Prior to Visit  Medication Sig Dispense Refill  . traMADol (ULTRAM) 50 MG tablet Take 1 tablet (50 mg total) by mouth every 8 (eight) hours as needed for pain.  30 tablet  0   No current facility-administered medications on file prior to visit.    Review of Systems:  As per HPI- otherwise negative. Wt Readings from Last 3 Encounters:  02/06/13 201 lb (91.173 kg)  01/03/12 197 lb (89.359 kg)  08/12/11 205 lb (92.987 kg)   Physical Examination: Filed Vitals:   02/06/13 1023  BP: 120/84  Pulse: 82  Temp: 99.1 F (37.3 C)  Resp: 16   Filed Vitals:   02/06/13 1023  Height: 5\' 9"  (1.753 m)  Weight: 201 lb (91.173 kg)   Body mass index is 29.67 kg/(m^2). Ideal Body Weight: Weight in (lb) to have BMI = 25: 168.9  GEN: WDWN, NAD, Non-toxic, A & O x 3, overweight, looks well HEENT: Atraumatic, Normocephalic. Neck supple. No masses, No LAD.  Bilateral TM wnl, oropharynx normal.  PEERL,EOMI.   Ears and Nose: No external deformity.  CV: RRR, No M/G/R. No JVD. No thrill. No extra heart sounds. PULM: CTA B, no wheezes, crackles, rhonchi. No retractions. No resp. distress. No accessory muscle use. ABD: S, NT, ND, +BS. No rebound. No HSM. EXTR: No c/c/e NEURO Normal gait.  PSYCH: Normally interactive. Conversant. Not depressed or anxious appearing.  Calm demeanor.   [facility] reading (PRIMARY) by  Dr. Lorelei Pont. CXR: negative  CHEST 2 VIEW  COMPARISON: 03/04/2009  FINDINGS: Normal heart size, mediastinal contours, and pulmonary vascularity.  Lungs clear.  No pleural effusion or pneumothorax.  Bones unremarkable.  IMPRESSION: No acute abnormalities.   Results for orders placed in visit on 02/06/13  POCT CBC      Result Value Range   WBC 6.5  4.6 - 10.2 K/uL   Lymph, poc 2.1  0.6 - 3.4   POC LYMPH PERCENT 33.0  10 - 50 %L    MID (cbc) 0.6  0 - 0.9   POC MID % 10.0  0 - 12 %M   POC Granulocyte 3.7  2 - 6.9   Granulocyte percent 57.0  37 - 80 %G   RBC 4.90  4.69 - 6.13 M/uL   Hemoglobin 14.7  14.1 - 18.1 g/dL   HCT, POC 46.2  43.5 - 53.7 %   MCV 94.3  80 - 97 fL   MCH, POC 30.0  27 - 31.2 pg   MCHC 31.8  31.8 - 35.4 g/dL   RDW, POC 13.7     Platelet Count, POC 209  142 - 424 K/uL   MPV 9.7  0 - 99.8 fL    Assessment and Plan: Walking pneumonia - Plan: azithromycin (ZITHROMAX) 250 MG tablet  Cough - Plan: POCT CBC, DG Chest 2 View, HYDROcodone-homatropine (HYCODAN) 5-1.5 MG/5ML syrup  Low grade fever - Plan: DG Chest 2 View  At this time his blood count and CXR are reassuring.  Will treat with azithromycin and hycodan as needed See patient instructions for more details.   Encouraged him to stop his daily MJ smoking; this is also not good for his lungs   Signed Lamar Blinks, MD

## 2014-07-10 IMAGING — CR DG FINGER INDEX 2+V*R*
1 series · 1 of 1 positions shown · non-contrast
Comparison: Preliminary reading Dr. Kaler

CLINICAL DATA: Injury

RIGHT INDEX FINGER 2+V

[PA]
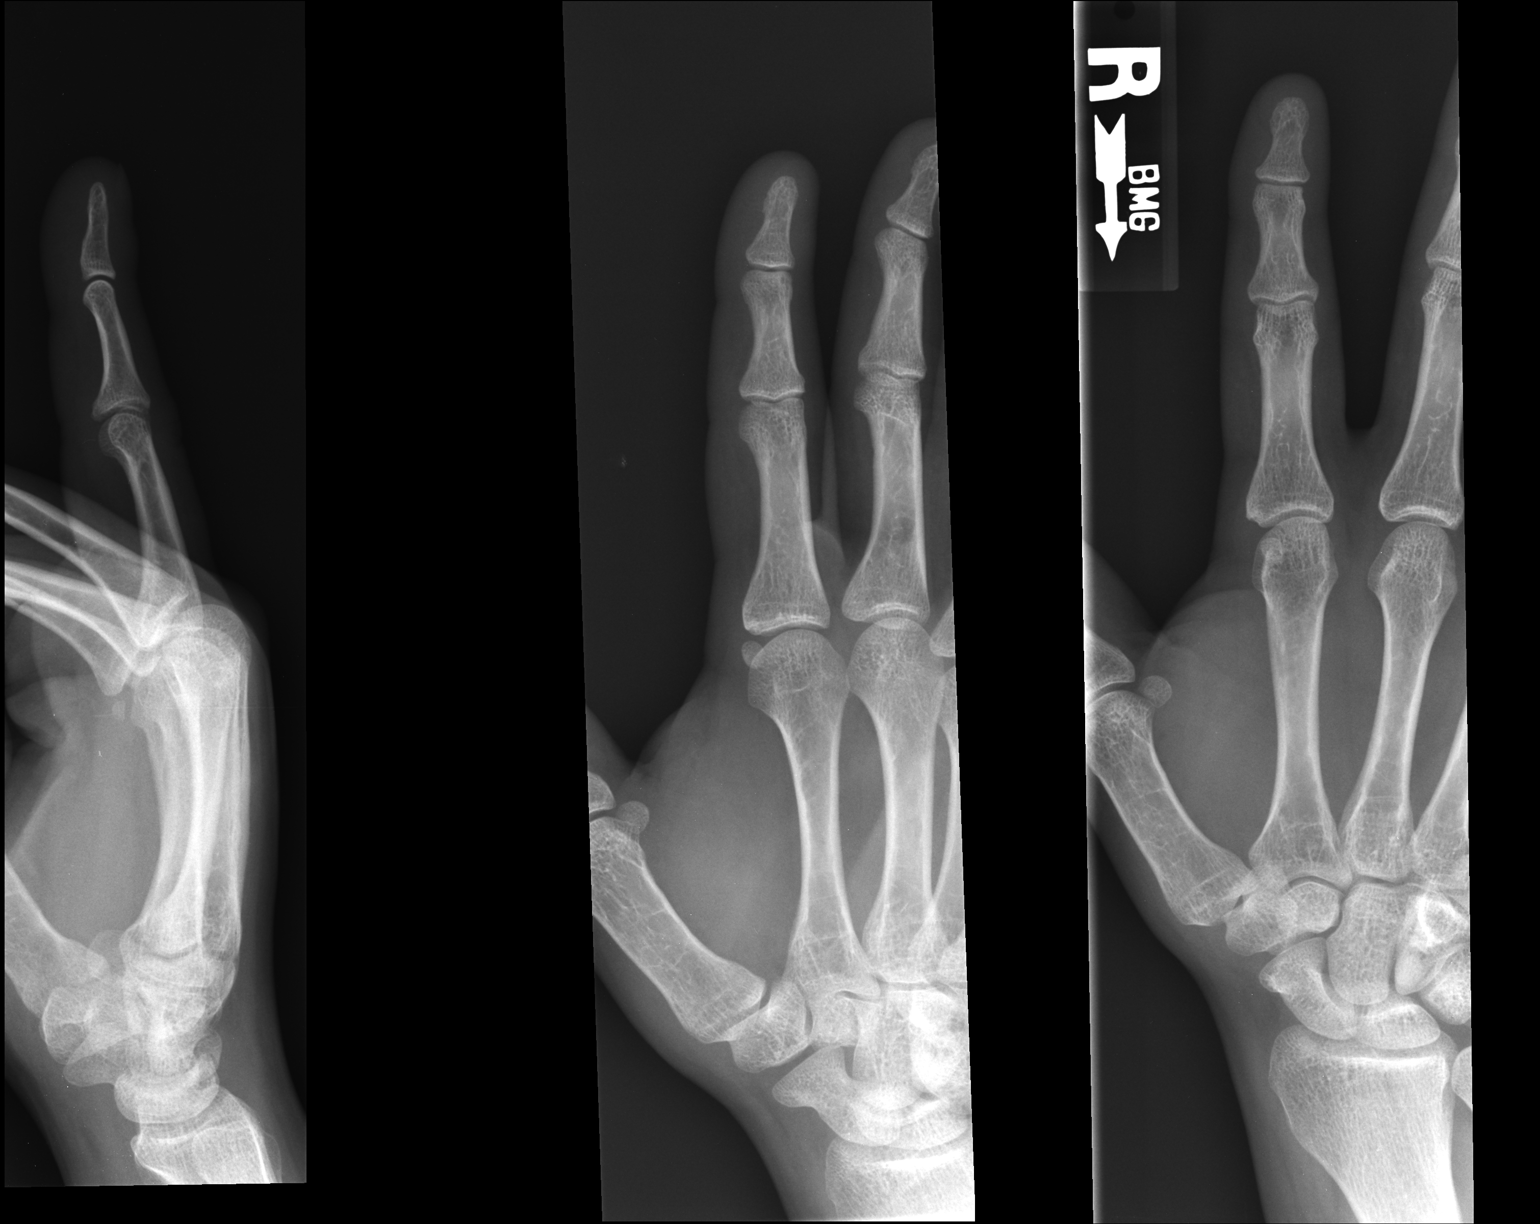

[1 of 1 positions shown; findings below may reference images not displayed]

FINDINGS: Three views of the right second finger submitted.  No
acute fracture or subluxation.
IMPRESSION: No acute fracture or subluxation.

## 2015-08-14 IMAGING — CR DG CHEST 2V
2 series · 2 of 2 positions shown · non-contrast
Comparison: 03/04/2009

CLINICAL DATA: Shortness of breath, cough, low-grade fever, history
lymphoma

EXAM:
CHEST  2 VIEW

[PA]
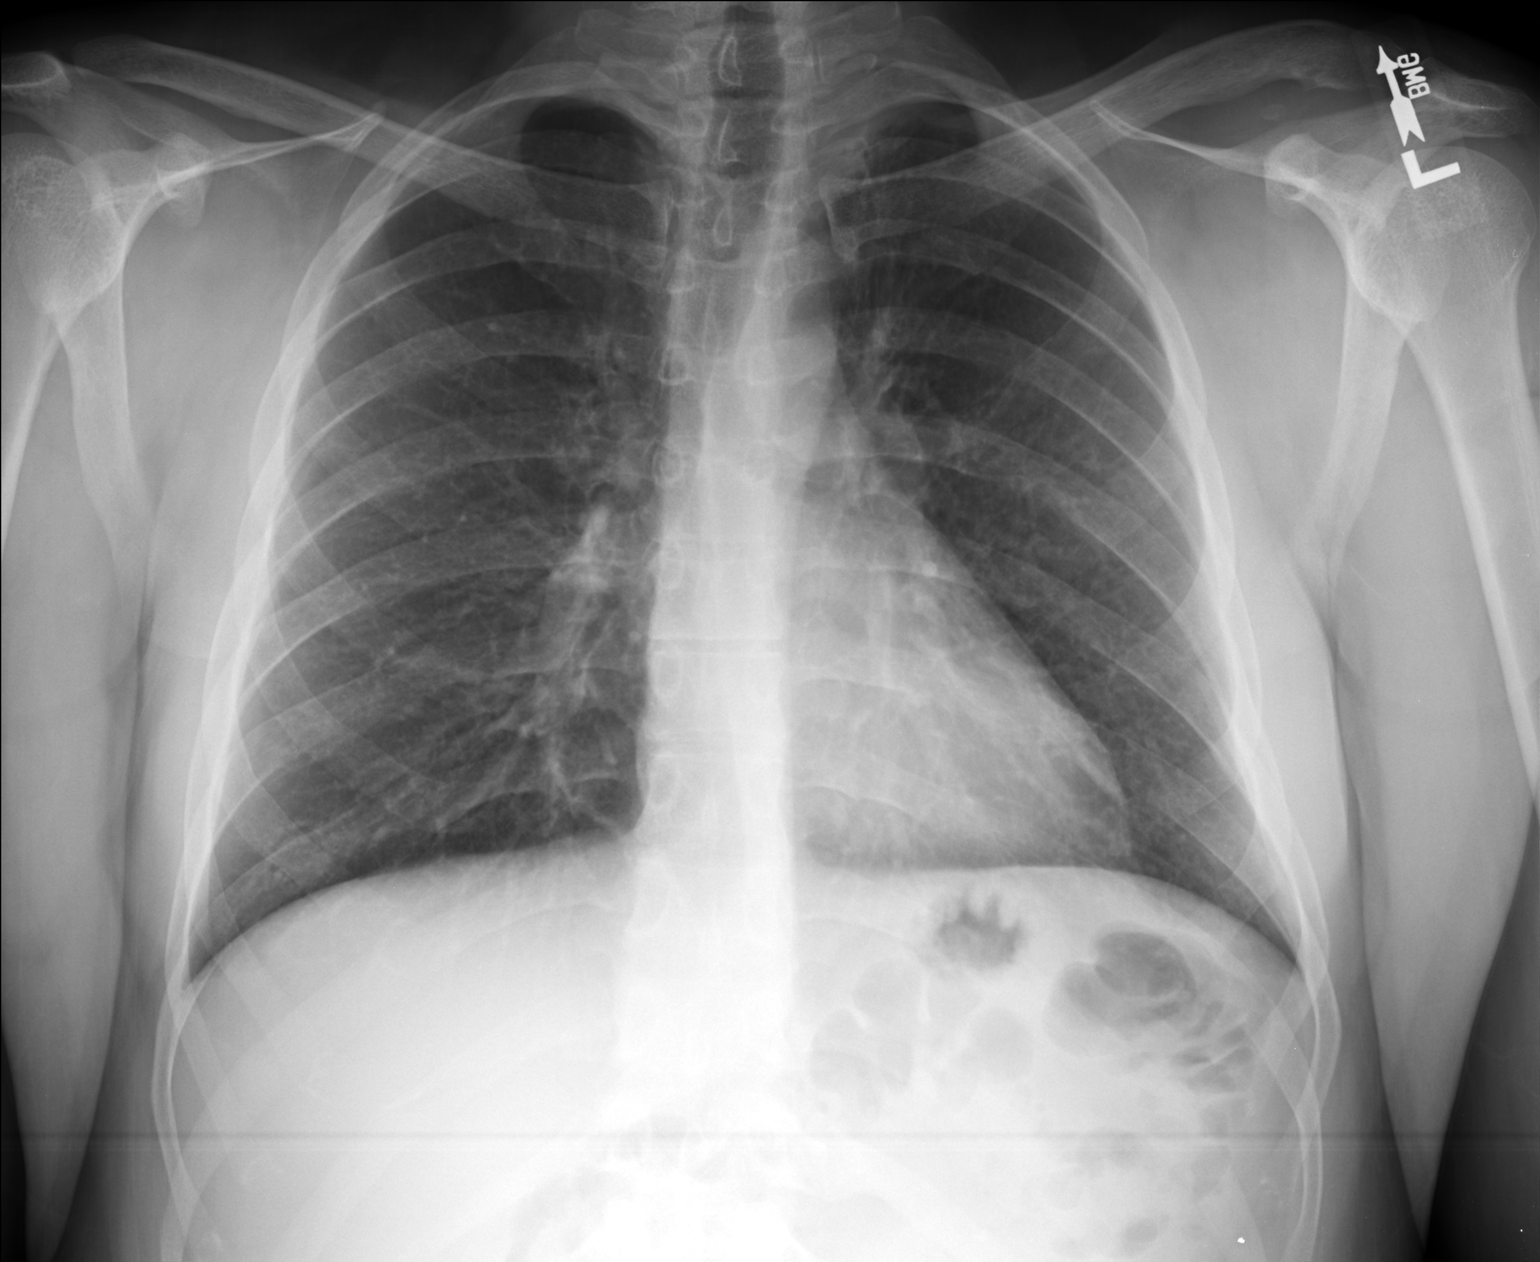

[lateral]
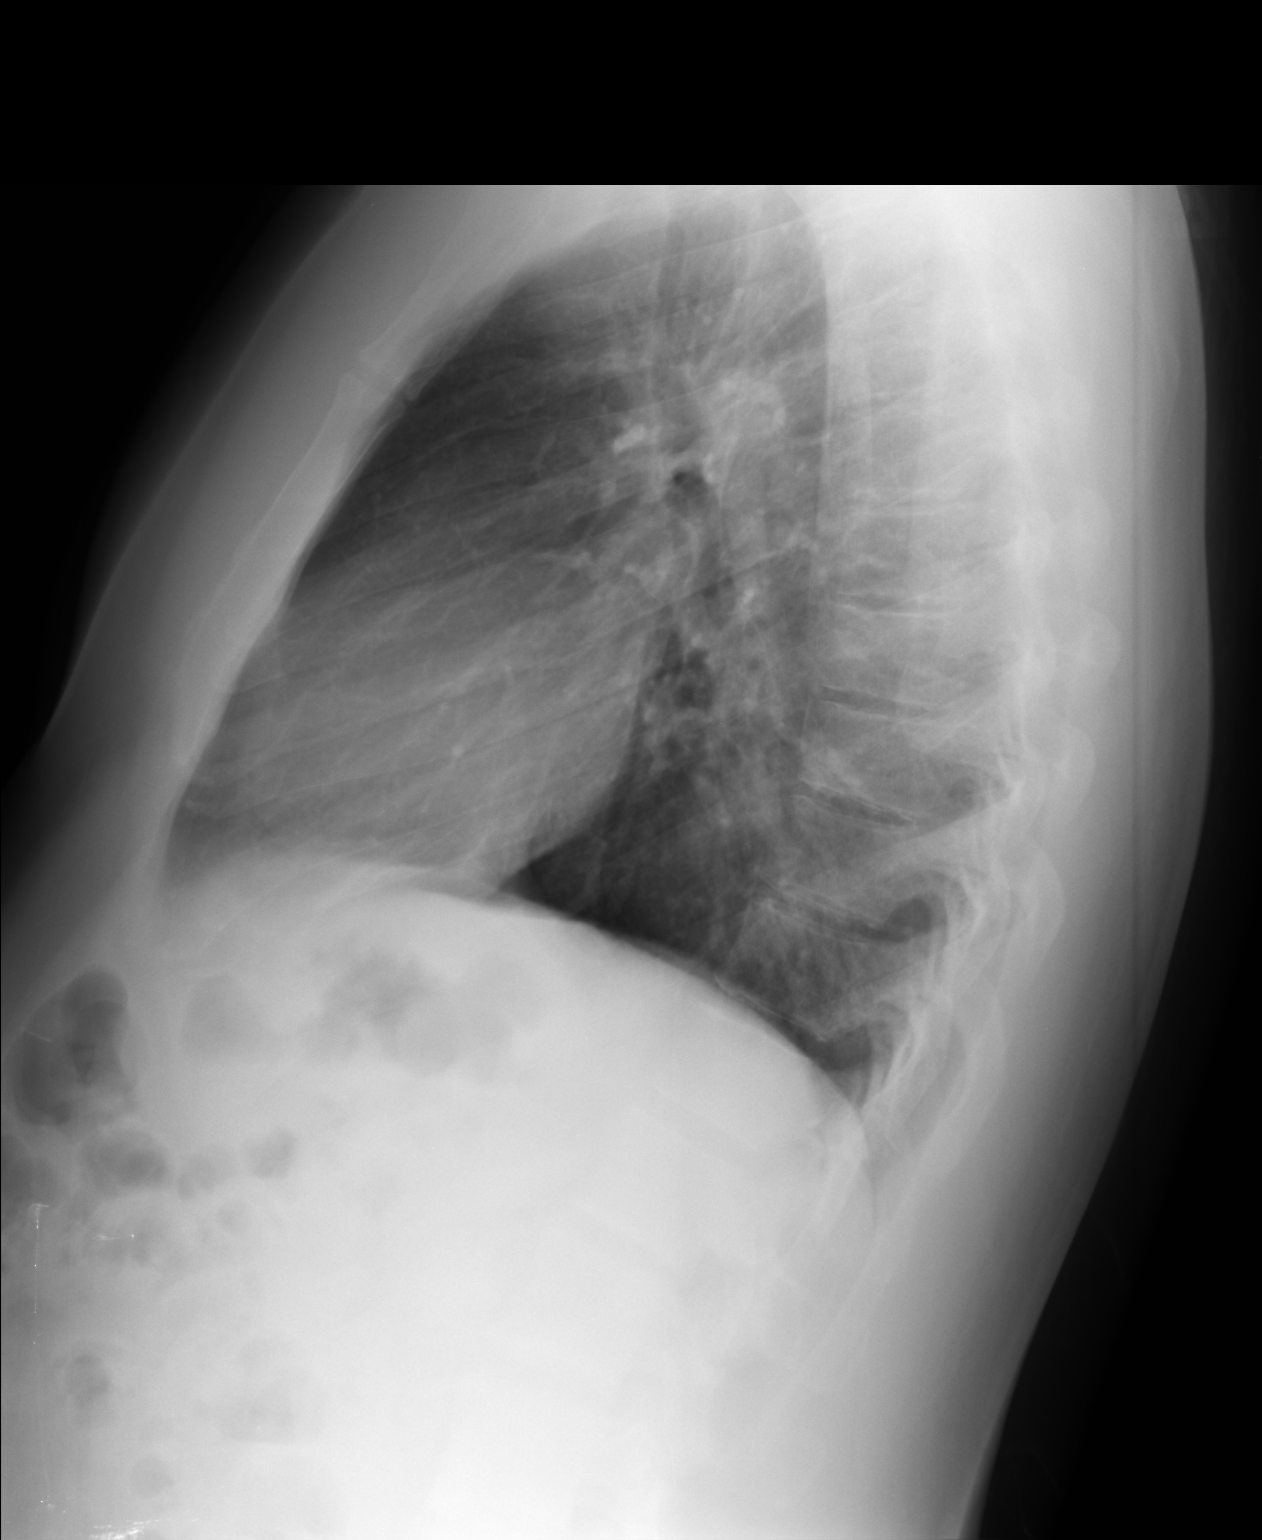

[2 of 2 positions shown; findings below may reference images not displayed]

FINDINGS: Normal heart size, mediastinal contours, and pulmonary vascularity.

Lungs clear.

No pleural effusion or pneumothorax.

Bones unremarkable.
IMPRESSION: No acute abnormalities.
